# Patient Record
Sex: Male | Born: 1945 | Race: Black or African American | Hispanic: No | Marital: Married | Smoking: Former smoker
Health system: Southern US, Community
[De-identification: ages and names within clinical notes are randomized; demographics above are authoritative.]

## PROBLEM LIST (undated history)

## (undated) DIAGNOSIS — G35 Multiple sclerosis: Secondary | ICD-10-CM

## (undated) DIAGNOSIS — I10 Essential (primary) hypertension: Secondary | ICD-10-CM

## (undated) DIAGNOSIS — I2510 Atherosclerotic heart disease of native coronary artery without angina pectoris: Secondary | ICD-10-CM

## (undated) DIAGNOSIS — I639 Cerebral infarction, unspecified: Secondary | ICD-10-CM

## (undated) DIAGNOSIS — G35D Multiple sclerosis, unspecified: Secondary | ICD-10-CM

## (undated) DIAGNOSIS — E119 Type 2 diabetes mellitus without complications: Secondary | ICD-10-CM

---

## 2019-01-27 ENCOUNTER — Encounter: Payer: Self-pay | Admitting: Emergency Medicine

## 2019-01-27 ENCOUNTER — Emergency Department: Payer: Medicare Other

## 2019-01-27 ENCOUNTER — Emergency Department
Admission: EM | Admit: 2019-01-27 | Discharge: 2019-01-27 | Disposition: A | Discharge status: Home / Self Care | Payer: Medicare Other | Attending: Emergency Medicine | Admitting: Emergency Medicine

## 2019-01-27 DIAGNOSIS — Y92002 Bathroom of unspecified non-institutional (private) residence single-family (private) house as the place of occurrence of the external cause: Secondary | ICD-10-CM | POA: Insufficient documentation

## 2019-01-27 DIAGNOSIS — S31119A Laceration without foreign body of abdominal wall, unspecified quadrant without penetration into peritoneal cavity, initial encounter: Secondary | ICD-10-CM | POA: Insufficient documentation

## 2019-01-27 DIAGNOSIS — I10 Essential (primary) hypertension: Secondary | ICD-10-CM | POA: Diagnosis not present

## 2019-01-27 DIAGNOSIS — Y9389 Activity, other specified: Secondary | ICD-10-CM | POA: Diagnosis not present

## 2019-01-27 DIAGNOSIS — E119 Type 2 diabetes mellitus without complications: Secondary | ICD-10-CM | POA: Diagnosis not present

## 2019-01-27 DIAGNOSIS — Z79899 Other long term (current) drug therapy: Secondary | ICD-10-CM | POA: Diagnosis not present

## 2019-01-27 DIAGNOSIS — I2510 Atherosclerotic heart disease of native coronary artery without angina pectoris: Secondary | ICD-10-CM | POA: Insufficient documentation

## 2019-01-27 DIAGNOSIS — W010XXA Fall on same level from slipping, tripping and stumbling without subsequent striking against object, initial encounter: Secondary | ICD-10-CM | POA: Diagnosis not present

## 2019-01-27 DIAGNOSIS — Y998 Other external cause status: Secondary | ICD-10-CM | POA: Insufficient documentation

## 2019-01-27 DIAGNOSIS — W19XXXA Unspecified fall, initial encounter: Secondary | ICD-10-CM

## 2019-01-27 HISTORY — DX: Atherosclerotic heart disease of native coronary artery without angina pectoris: I25.10

## 2019-01-27 HISTORY — DX: Multiple sclerosis: G35

## 2019-01-27 HISTORY — DX: Essential (primary) hypertension: I10

## 2019-01-27 HISTORY — DX: Type 2 diabetes mellitus without complications: E11.9

## 2019-01-27 HISTORY — DX: Cerebral infarction, unspecified: I63.9

## 2019-01-27 HISTORY — DX: Multiple sclerosis, unspecified: G35.D

## 2019-01-27 LAB — TROPONIN I: Troponin I: <0.03 ng/mL (ref <0.03)

## 2019-01-27 LAB — CBC
HCT: 34.6 % — ABNORMAL LOW (ref 39.0–52.0)
Hemoglobin: 11.3 g/dL — ABNORMAL LOW (ref 13.0–17.0)
MCH: 26.4 pg (ref 26.0–34.0)
MCHC: 32.7 g/dL (ref 30.0–36.0)
MCV: 80.8 fL (ref 80.0–100.0)
Platelets: 153 10*3/uL (ref 150–400)
RBC: 4.28 MIL/uL (ref 4.22–5.81)
RDW: 14.2 % (ref 11.5–15.5)
WBC: 6 10*3/uL (ref 4.0–10.5)
nRBC: 0 % (ref 0.0–0.2)

## 2019-01-27 LAB — COMPREHENSIVE METABOLIC PANEL
ALT: 18 U/L (ref 0–44)
AST: 19 U/L (ref 15–41)
Albumin: 3.6 g/dL (ref 3.5–5.0)
Alkaline Phosphatase: 100 U/L (ref 38–126)
Anion gap: 5 (ref 5–15)
BUN: 29 mg/dL — ABNORMAL HIGH (ref 8–23)
CO2: 27 mmol/L (ref 22–32)
Calcium: 9.5 mg/dL (ref 8.9–10.3)
Chloride: 112 mmol/L — ABNORMAL HIGH (ref 98–111)
Creatinine, Ser: 2.15 mg/dL — ABNORMAL HIGH (ref 0.61–1.24)
GFR calc Af Amer: 35 mL/min — ABNORMAL LOW (ref >60)
GFR calc non Af Amer: 30 mL/min — ABNORMAL LOW (ref >60)
Glucose, Bld: 98 mg/dL (ref 70–99)
Potassium: 4.6 mmol/L (ref 3.5–5.1)
Sodium: 144 mmol/L (ref 135–145)
Total Bilirubin: 1 mg/dL (ref 0.3–1.2)
Total Protein: 6.8 g/dL (ref 6.5–8.1)

## 2019-01-27 MED ORDER — CEPHALEXIN 500 MG PO CAPS: 500.0000 mg | ORAL_CAPSULE | Freq: Two times a day (BID) | ORAL | 0 refills | Status: AC

## 2019-01-27 NOTE — ED Provider Notes (Signed)
[facility] Emergency Department Provider Note   ____________________________________________    I have reviewed the triage vital signs and the nursing notes.   HISTORY  Chief Complaint Fall     HPI Kirk Sherman is a 73 y.o. male who presents after a fall.  Patient with history of CAD, diabetes, hypertension, stroke and multiple sclerosis who was not able to ambulate on his own apparently fell while in the bathroom today.  He denies head injury.  Only complaint is bleeding from groin area.  EMS reports that he appears to be bleeding from scrotum.  Reportedly not on blood thinners although he is on Plavix per medication report.  Past Medical History:  Diagnosis Date  . Coronary artery disease   . Diabetes mellitus without complication (HCC)   . Hypertension   . Multiple sclerosis (HCC)   . Stroke [facility])     There are no active problems to display for this patient.     Prior to Admission medications   Medication Sig Start Date End Date Taking? Authorizing Provider  amLODipine (NORVASC) 5 MG tablet Take 5 mg by mouth daily. 12/17/18  Yes [provider]  aspirin EC 81 MG tablet Take 81 mg by mouth daily.   Yes [provider]  atorvastatin (LIPITOR) 80 MG tablet Take 80 mg by mouth daily. 11/29/18  Yes [provider]  carvedilol (COREG) 12.5 MG tablet Take 12.5 mg by mouth 2 (two) times daily. 12/15/18  Yes [provider]  clopidogrel (PLAVIX) 75 MG tablet Take 75 mg by mouth daily. 12/15/18  Yes [provider]  furosemide (LASIX) 20 MG tablet Take 20 mg by mouth 3 (three) times a week. 12/01/18  Yes [provider]  gabapentin (NEURONTIN) 100 MG capsule Take 200 mg by mouth 3 (three) times daily. 12/17/18  Yes [provider]  LANTUS 100 UNIT/ML injection Inject 20 Units into the skin every evening. 01/12/19  Yes [provider]  lisinopril (ZESTRIL) 40 MG tablet Take 40 mg by mouth  daily. 12/15/18  Yes [provider]  sertraline (ZOLOFT) 100 MG tablet Take 100 mg by mouth daily. 12/26/18  Yes [provider]  spironolactone (ALDACTONE) 25 MG tablet Take 25 mg by mouth daily. 12/15/18  Yes [provider]  tamsulosin (FLOMAX) 0.4 MG CAPS capsule Take 0.4 mg by mouth daily. 12/15/18  Yes [provider]  cephALEXin (KEFLEX) 500 MG capsule Take 1 capsule (500 mg total) by mouth 2 (two) times daily. 01/27/19   Jene Every, MD     Allergies Pseudoephedrine  No family history on file.  Social History Social History   Tobacco Use  . Smoking status: Former Games developer  . Smokeless tobacco: Never Used  Substance Use Topics  . Alcohol use: Not Currently  . Drug use: Not on file    Review of Systems  Constitutional: No fever/chills Eyes: No visual changes.  ENT: No neck pain Cardiovascular: Denies chest pain. Respiratory: Denies shortness of breath. Gastrointestinal: No abdominal pain.  No nausea, no vomiting.   Genitourinary: Bleeding from groin Musculoskeletal: Negative for back pain. Skin: Laceration Neurological: Negative for headaches or weakness   ____________________________________________   PHYSICAL EXAM:  VITAL SIGNS: ED Triage Vitals  Enc Vitals Group     BP 01/27/19 1034 140/89     Pulse Rate 01/27/19 1034 (!) 55     Resp 01/27/19 1034 16     Temp 01/27/19 1034 98.2 F (36.8 C)  Temp Source 01/27/19 1034 Oral     SpO2 01/27/19 1034 94 %     Weight 01/27/19 1035 86.9 kg (191 lb 8 oz)     Height 01/27/19 1035 1.791 m (5' 10.5")     Head Circumference --      Peak Flow --      Pain Score 01/27/19 1034 0     Pain Loc --      Pain Edu? --      Excl. in GC? --     Constitutional: Alert and oriented. Eyes: Conjunctivae are normal.  Head: Atraumatic. Nose: no swelling epistaxis Mouth/Throat: Mucous membranes are moist.   Neck:  Painless ROM, no vertebral tenderness palpation Cardiovascular: Normal  rate, regular rhythm. Grossly normal heart sounds.  Good peripheral circulation. Respiratory: Normal respiratory effort.  No retractions. Lungs CTAB. Gastrointestinal: Soft and nontender. No distention.  Genitourinary: Patient with 4 cm curvilinear laceration to the perineum, small arterial bleeder noted Musculoskeletal:   Warm and well perfused Neurologic:  Normal speech and language. No gross focal neurologic deficits are appreciated.  Skin:  Skin is warm, dry and intact. No rash noted. Psychiatric: Mood and affect are normal. Speech and behavior are normal.  ____________________________________________   LABS (all labs ordered are listed, but only abnormal results are displayed)  Labs Reviewed  CBC - Abnormal; Notable for the following components:      Result Value   Hemoglobin 11.3 (*)    HCT 34.6 (*)    All other components within normal limits  COMPREHENSIVE METABOLIC PANEL - Abnormal; Notable for the following components:   Chloride 112 (*)    BUN 29 (*)    Creatinine, Ser 2.15 (*)    GFR calc non Af Amer 30 (*)    GFR calc Af Amer 35 (*)    All other components within normal limits  TROPONIN I   ____________________________________________  EKG  None ____________________________________________  RADIOLOGY  X-ray unremarkable ____________________________________________   PROCEDURES  Procedure(s) performed: yes  .Marland Kitchen.Laceration Repair Date/Time: 01/27/2019 2:08 PM Performed by: Jene EveryKinner, Waino, MD Authorized by: Jene EveryKinner, Vernell, MD   Consent:    Consent obtained:  Verbal   Consent given by:  Patient   Risks discussed:  Infection, pain, poor wound healing, vascular damage and need for additional repair Anesthesia (see MAR for exact dosages):    Anesthesia method:  Local infiltration   Local anesthetic:  Lidocaine 2% WITH epi Laceration details:    Location:  Anogenital   Anogenital location:  Perineum   Length (cm):  4 Repair type:    Repair type:   Intermediate Exploration:    Hemostasis achieved with:  Direct pressure   Wound exploration: entire depth of wound probed and visualized     Contaminated: no   Treatment:    Area cleansed with:  Saline   Amount of cleaning:  Extensive   Irrigation solution:  Sterile saline   Visualized foreign bodies/material removed: no   Skin repair:    Repair method:  Sutures   Suture size:  4-0   Suture material:  Prolene   Suture technique:  Simple interrupted   Number of sutures:  4 Approximation:    Approximation:  Close Post-procedure details:    Dressing:  Sterile dressing and non-adherent dressing   Patient tolerance of procedure:  Tolerated well, no immediate complications     Critical Care performed: No ____________________________________________   INITIAL IMPRESSION / ASSESSMENT AND PLAN / ED COURSE  Pertinent labs & imaging  results that were available during my care of the patient were reviewed by me and considered in my medical decision making (see chart for details).  Patient with fall as described above, overall is well-appearing however does have a laceration to the perineum with some active bleeding.  This was sutured with 4-0 Prolene which will need to be removed.  Bleeding controlled after suturing  Lab work is overall reassuring, patient with a mildly elevated creatinine likely related to dehydration.   ----------------------------------------- 2:51 PM on 01/27/2019 -----------------------------------------  Rechecked laceration site, no further bleeding we will place the patient on prophylactic Keflex, emphasized the need for follow-up and to keep the area clean.  He will need to have sutures removed in 7 days    ____________________________________________   FINAL CLINICAL IMPRESSION(S) / ED DIAGNOSES  Final diagnoses:  Fall, initial encounter  Laceration of groin, initial encounter        Note:  This document was prepared using Dragon voice  recognition software and may include unintentional dictation errors.   Jene Every, MD 01/27/19 1452

## 2019-01-27 NOTE — Discharge Instructions (Signed)
Please follow-up in 1 week to have sutures in your groin removed be sure to clean this area with soap and water on a daily basis.  Take your antibiotics as prescribed

## 2019-01-27 NOTE — ED Triage Notes (Addendum)
Patient presents to the ED via EMS from home post fall.  Patient denies losing consciousness and hitting his head.  Slipped in the bathroom.  Patient states he is sometimes hypotensive in the am.  Patient is alert and oriented x 4 at this time.  Patient has laceration to groin area.

## 2019-05-31 DEATH — deceased

## 2020-12-06 IMAGING — DX PORTABLE CHEST - 1 VIEW
1 series · 1 of 1 positions shown · non-contrast
Comparison: None.

CLINICAL DATA: Fall, weakness.

EXAM:
PORTABLE CHEST 1 VIEW

[chest ap]
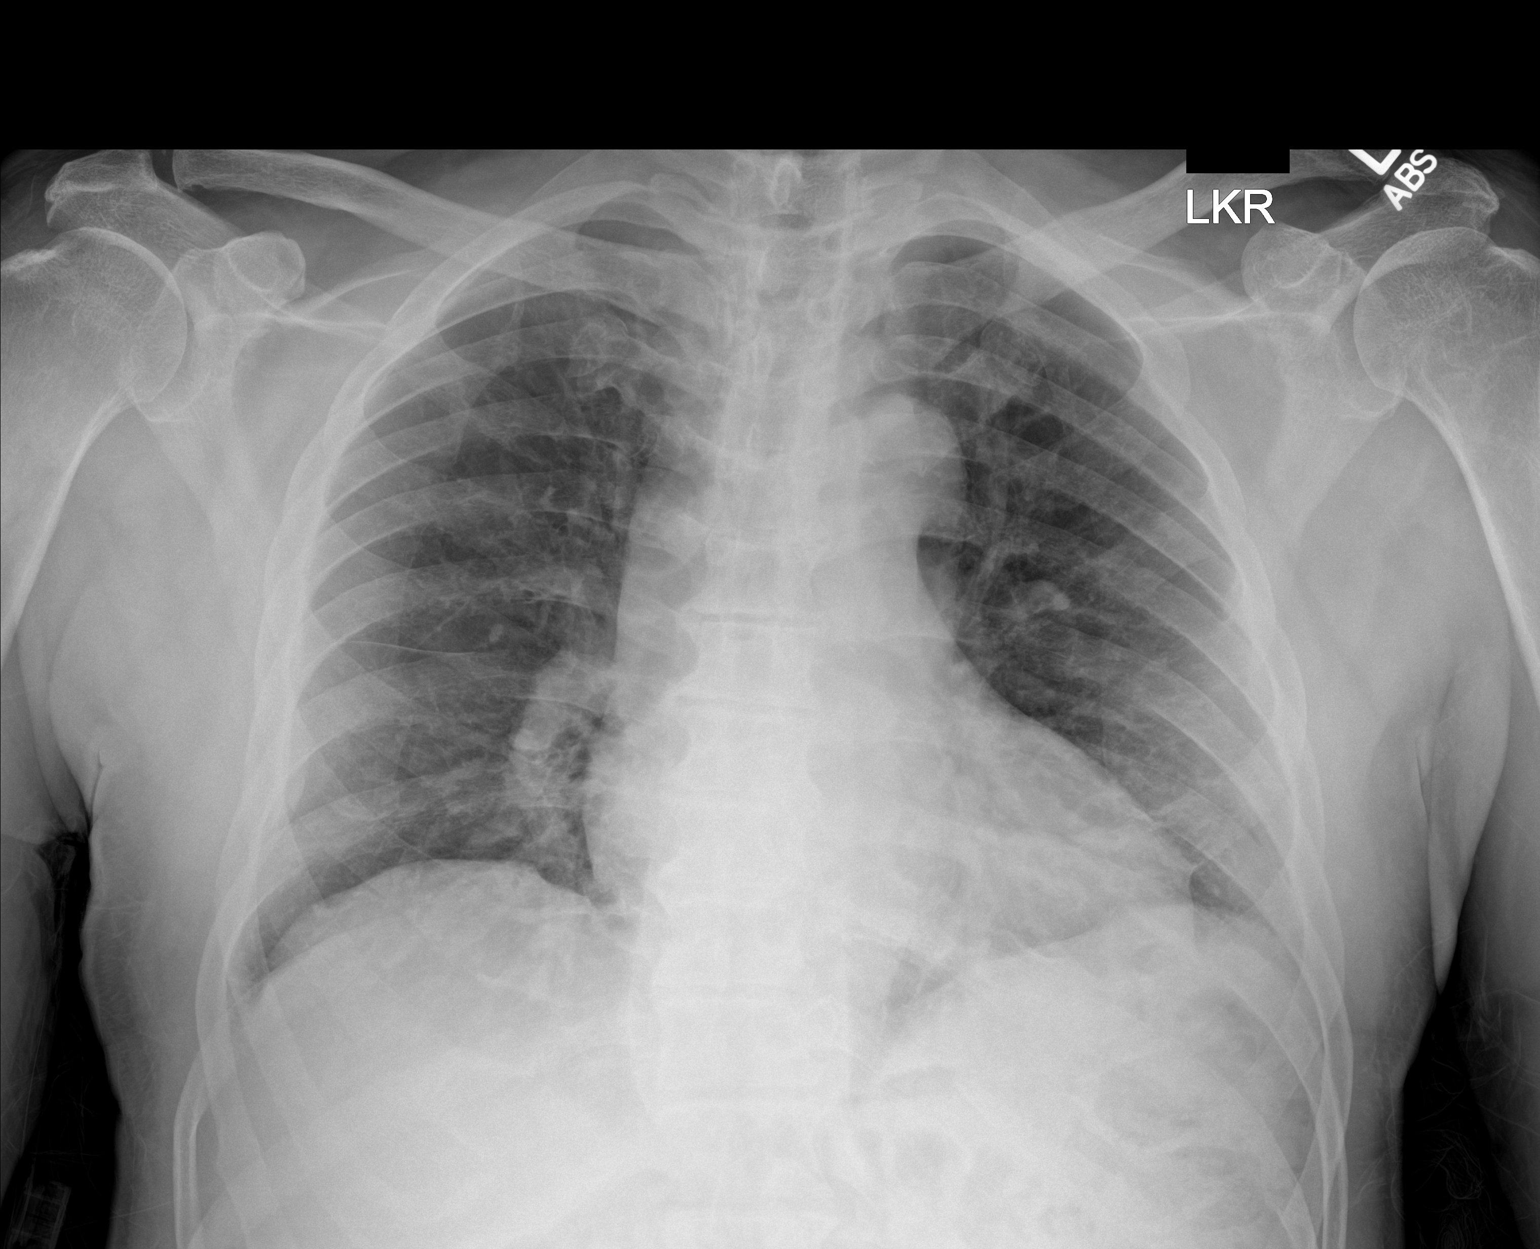

[1 of 1 positions shown; findings below may reference images not displayed]

FINDINGS: The heart size and mediastinal contours are within normal limits.
Both lungs are clear. The visualized skeletal structures are
unremarkable.
IMPRESSION: No active disease.
